# Patient Record
Sex: Male | Born: 1990 | Race: White | Hispanic: No | Marital: Married | Smoking: Former smoker
Health system: Southern US, Community
[De-identification: ages and names within clinical notes are randomized; demographics above are authoritative.]

## PROBLEM LIST (undated history)

## (undated) ENCOUNTER — Emergency Department (HOSPITAL_BASED_OUTPATIENT_CLINIC_OR_DEPARTMENT_OTHER): Admission: EM | Payer: No Typology Code available for payment source

---

## 2010-04-20 ENCOUNTER — Emergency Department (HOSPITAL_BASED_OUTPATIENT_CLINIC_OR_DEPARTMENT_OTHER)
Admission: EM | Admit: 2010-04-20 | Discharge: 2010-04-20 | Payer: Self-pay | Source: Home / Self Care | Admitting: Emergency Medicine

## 2014-05-04 ENCOUNTER — Other Ambulatory Visit: Payer: Self-pay | Admitting: Family Medicine

## 2014-05-04 DIAGNOSIS — R109 Unspecified abdominal pain: Secondary | ICD-10-CM

## 2014-05-04 DIAGNOSIS — R10A1 Flank pain, right side: Secondary | ICD-10-CM

## 2014-05-10 ENCOUNTER — Ambulatory Visit
Admission: RE | Admit: 2014-05-10 | Discharge: 2014-05-10 | Disposition: A | Discharge status: Home / Self Care | Payer: No Typology Code available for payment source | Source: Ambulatory Visit | Attending: Family Medicine | Admitting: Family Medicine

## 2014-05-10 DIAGNOSIS — R109 Unspecified abdominal pain: Secondary | ICD-10-CM

## 2017-09-09 ENCOUNTER — Ambulatory Visit (INDEPENDENT_AMBULATORY_CARE_PROVIDER_SITE_OTHER): Payer: No Typology Code available for payment source | Admitting: Family Medicine

## 2017-09-09 ENCOUNTER — Other Ambulatory Visit: Payer: Self-pay

## 2017-09-09 ENCOUNTER — Encounter: Payer: Self-pay | Admitting: Family Medicine

## 2017-09-09 VITALS — BP 112/76 | HR 67 | Temp 98.3°F | Ht 69.0 in | Wt 245.0 lb

## 2017-09-09 DIAGNOSIS — R200 Anesthesia of skin: Secondary | ICD-10-CM | POA: Insufficient documentation

## 2017-09-09 DIAGNOSIS — Z7689 Persons encountering health services in other specified circumstances: Secondary | ICD-10-CM | POA: Insufficient documentation

## 2017-09-09 DIAGNOSIS — Z23 Encounter for immunization: Secondary | ICD-10-CM

## 2017-09-09 NOTE — Progress Notes (Signed)
   Subjective:    Benjamin Mack - 27 y.o. male MRN [id number]  Date of birth: 1990/07/07  HPI  Benjamin Mack is here to establish care.  His concerns include: numbness in right shoulder.  The numbness started gradually over the last six months to one year and occurs every day.  His symptoms are intermittent and are accompanied by cramps with subsequent soreness the next day.  Sometimes he feels tingling in the area, but he rarely feels pain.  He has not had a previous injury to his shoulder or back, but he does lift heavy shingles daily.  Nothing seems to make it worse, and he has not tried anything to make it better.  PMH: none  PSH:  none  Meds: none  Social Hx: does not drink alcohol or use recreational drugs.  Quit cigarettes in 2013.  Lives with wife, Donata Clay, and daughter, Dorita Fray.  Works as a Designer, fashion/clothing.  Health Maintenance:  Health Maintenance Due  Topic Date Due  . HIV Screening  02/26/2005  . TETANUS/TDAP  02/26/2009    -  reports that he quit smoking about 6 years ago. He has never used smokeless tobacco. - Review of Systems: Per HPI. - Past Medical History: Patient Active Problem List   Diagnosis Date Noted  . Encounter to establish care with new doctor 09/09/2017  . Numbness 09/09/2017   - Medications: reviewed and updated   Objective:   Physical Exam BP 112/76   Pulse 67   Temp 98.3 F (36.8 C) (Oral)   Ht  (1.753 m)   Wt 245 lb (111.1 kg)   SpO2 97%   BMI 36.18 kg/m  Gen: NAD, alert, cooperative with exam, well-appearing, obese HEENT: NCAT, PERRL, clear conjunctiva, oropharynx clear, supple neck CV: RRR, good S1/S2, no murmur, no edema Resp: CTABL, no wheezes, non-labored Abd: SNTND, BS present, no guarding or organomegaly Skin: no rashes, normal turgor  Neuro: no gross deficits.  Psych: good insight, alert and oriented Musculoskeletal: no visual difference between shoulders, nontender to palpation, full ROM, strength testing normal but does elicit  feelings of numbness and tingling, especially with internal rotation.    Assessment & Plan:   Encounter to establish care with new doctor Received Tdap and HIV screening today.  Counseled on exercise and diet.  History reviewed.  Numbness Unsure what the cause of his numbness is, but it could be related to a traction injury from heavy lifting.  Likely nerve injury, but unsure if it is dermatomal in nature or cutaneous.  Will obtain cervical spine xrays.  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  He would like to abstain from medication currently, which is appropriate as well.    Lezlie Octave, M.D. 09/09/2017, 9:45 AM PGY-1, [facility]

## 2017-09-09 NOTE — Assessment & Plan Note (Signed)
Unsure what the cause of his numbness is, but it could be related to a traction injury from heavy lifting.  Likely nerve injury, but unsure if it is dermatomal in nature or cutaneous.  Will obtain cervical spine xrays.  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  He would like to abstain from medication currently, which is appropriate as well.

## 2017-09-09 NOTE — Patient Instructions (Addendum)
It was nice meeting you today Benjamin Mack!  I am ordering cervical spine x rays, which you can get completed by going to [facility] at [facility] [address].  They are open 8-5 M-F and you do not need an appointment.  We will let you know if your results from your HIV screen or your xrays are abnormal.    I would like to see you back in one year or earlier if you need.  If you have any questions or concerns, please feel free to call the clinic.   Be well,  Dr. Frances Furbish

## 2017-09-09 NOTE — Assessment & Plan Note (Signed)
Received Tdap and HIV screening today.  Counseled on exercise and diet.  History reviewed.

## 2017-09-10 ENCOUNTER — Telehealth: Payer: Self-pay | Admitting: *Deleted

## 2017-09-10 LAB — HIV ANTIBODY (ROUTINE TESTING W REFLEX): HIV Screen 4th Generation wRfx: NONREACTIVE

## 2017-09-10 NOTE — Telephone Encounter (Signed)
LVM to call office back to inform pt of below. Zimmerman Rumple, Oluwadamilola Deliz D, CMA  

## 2017-09-10 NOTE — Telephone Encounter (Signed)
-----   Message from Lennox Solders, MD sent at 09/10/2017  9:17 AM EDT ----- [name] HIV screen is negative.

## 2017-09-16 NOTE — Telephone Encounter (Signed)
Pt informed. Zimmerman Rumple, Jeimy Bickert D, CMA  

## 2017-09-16 NOTE — Telephone Encounter (Signed)
Pt returning call to April. Call back [phone] Shawna Orleans, RN

## 2017-09-18 ENCOUNTER — Ambulatory Visit (INDEPENDENT_AMBULATORY_CARE_PROVIDER_SITE_OTHER): Payer: No Typology Code available for payment source | Admitting: Family Medicine

## 2017-09-18 ENCOUNTER — Other Ambulatory Visit: Payer: Self-pay

## 2017-09-18 ENCOUNTER — Encounter: Payer: Self-pay | Admitting: Family Medicine

## 2017-09-18 VITALS — BP 114/88 | HR 54 | Temp 98.9°F | Ht 69.0 in | Wt 244.2 lb

## 2017-09-18 DIAGNOSIS — W57XXXA Bitten or stung by nonvenomous insect and other nonvenomous arthropods, initial encounter: Secondary | ICD-10-CM

## 2017-09-18 DIAGNOSIS — S70362A Insect bite (nonvenomous), left thigh, initial encounter: Secondary | ICD-10-CM

## 2017-09-18 NOTE — Patient Instructions (Addendum)
It was great meeting you today Benjamin Mack! I am sorry you had to come in with that tick bit. I was able to remove the rest of the tick in pieces with tweezers. The ticks in [place] typically do not carry tick born illnesses such as lymes disease. The bigger risk is that they can predispose you getting a skin infection like any other insect bite would. I would recommend placing neosporin on the bite site for the next three days, two times per day to prevent a skin infection.  I would invest in some tick repellant spray if you are going to be working outside during the summer months. I would get OFF! Deep woods or ben's insect and tick repellent which can be bought for around $5 at [employer] or [employer]. Please come back and see [place] if you have any concerns about the site or develop any concerning symptoms.

## 2017-09-25 NOTE — Assessment & Plan Note (Signed)
Able to extract rest of tick head out with small amount of blood dissection with tweezers. Recommended putting bactroban ointment on wound a couple of times per day for next couple of days to prevent infection. Also recommended getting tick repellant to prevent future bites.

## 2017-09-25 NOTE — Progress Notes (Signed)
   HPI Patient presents as a same day after a tick bite 1 days prior to presentation. 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 He was able to get almost all of the tick out, but noticed that the tick head was still present. When he was unable to get all of it out, he came in for a same day appointment.  CC: tick bite   ROS:   Review of Systems See HPI for ROS.   CC, SH/smoking status, and VS noted  Objective: BP 114/88   Pulse (!) 54   Temp 98.9 F (37.2 C) (Oral)   Ht 5\' 9"  (1.753 m)   Wt 244 lb 3.2 oz (110.8 kg)   SpO2 98%   BMI 36.06 kg/m  Gen: NAD, alert, cooperative, and pleasant. HEENT: NCAT, EOMI, PERRL CV: RRR, no murmur Resp: CTAB, no wheezes, non-labored Abd: SNTND, BS present, no guarding or organomegaly Ext: No edema, warm Neuro: Alert and oriented, Speech clear, No gross deficits Left thigh: very small area of red skin, small amount of black arthropod in wound consistent with tick head   Assessment and plan:  Tick bite with subsequent removal of tick Able to extract rest of tick head out with small amount of blood dissection with tweezers. Recommended putting bactroban ointment on wound a couple of times per day for next couple of days to prevent infection. Also recommended getting tick repellant to prevent future bites.   No orders of the defined types were placed in this encounter.   No orders of the defined types were placed in this encounter.    Myrene BuddyJacob Len Azeez MD PGY-1 Family Medicine Resident  09/25/2017 6:46 AM

## 2020-08-24 ENCOUNTER — Telehealth: Payer: Self-pay

## 2020-08-24 NOTE — Telephone Encounter (Signed)
I called left pt a message to see if he intends to remain a pt, and advised to call before 09/18/20 if he desires to remain a pt here.

## 2021-04-12 ENCOUNTER — Other Ambulatory Visit: Payer: Self-pay

## 2021-04-12 ENCOUNTER — Ambulatory Visit
Admission: RE | Admit: 2021-04-12 | Discharge: 2021-04-12 | Disposition: A | Discharge status: Home / Self Care | Payer: No Typology Code available for payment source | Source: Ambulatory Visit | Attending: Emergency Medicine | Admitting: Emergency Medicine

## 2021-04-12 ENCOUNTER — Ambulatory Visit (HOSPITAL_BASED_OUTPATIENT_CLINIC_OR_DEPARTMENT_OTHER)
Admission: RE | Admit: 2021-04-12 | Discharge: 2021-04-12 | Disposition: A | Discharge status: Home / Self Care | Payer: No Typology Code available for payment source | Source: Ambulatory Visit | Attending: Emergency Medicine | Admitting: Emergency Medicine

## 2021-04-12 VITALS — BP 138/77 | HR 69 | Temp 98.7°F | Resp 20

## 2021-04-12 DIAGNOSIS — M25461 Effusion, right knee: Secondary | ICD-10-CM | POA: Diagnosis present

## 2021-04-12 DIAGNOSIS — M25561 Pain in right knee: Secondary | ICD-10-CM

## 2021-04-12 DIAGNOSIS — S86911A Strain of unspecified muscle(s) and tendon(s) at lower leg level, right leg, initial encounter: Secondary | ICD-10-CM

## 2021-04-12 MED ORDER — IBUPROFEN 800 MG PO TABS: 800.0000 mg | ORAL_TABLET | Freq: Three times a day (TID) | ORAL | 0 refills | Status: AC

## 2021-04-12 NOTE — ED Provider Notes (Signed)
[facility]    CSN: [id number] Arrival date & time: 04/12/21  1133    HISTORY  No chief complaint on file.  HPI Benjamin Mack is a 30 y.o. male. Patient states that last night he fell while running, states his right lower leg twisted inward and he landed on his right knee.  Patient states he has noticed increased swelling in his right knee, states he has no pain with flexion extension but when he tries to rotate his foot in and out or stand on his right leg, he has pain in his right knee on the lateral side.  Patient states he has been taking ibuprofen for pain.  The history is provided by the patient.  No past medical history on file. Patient Active Problem List   Diagnosis Date Noted   Tick bite with subsequent removal of tick 09/18/2017   Encounter to establish care with new doctor 09/09/2017   Numbness 09/09/2017   No past surgical history on file.  Home Medications    Prior to Admission medications   Medication Sig Start Date End Date Taking? Authorizing Provider  ibuprofen (ADVIL) 800 MG tablet Take 1 tablet (800 mg total) by mouth 3 (three) times daily. 04/12/21  Yes Theadora Rama Scales, PA-C    Family History Family History  Problem Relation Age of Onset   Birth defects Maternal Grandfather    Social History Social History   Tobacco Use   Smoking status: Former    Types: Cigarettes    Quit date: 2013    Years since quitting: 9.9   Smokeless tobacco: Never  Vaping Use   Vaping Use: Never used  Substance Use Topics   Alcohol use: Not Currently   Drug use: Not Currently   Allergies   Patient has no known allergies.  Review of Systems Review of Systems Pertinent findings noted in history of present illness.   Physical Exam Triage Vital Signs ED Triage Vitals  Enc Vitals Group     BP 02/15/21 0827 (!) 147/82     Pulse Rate 02/15/21 0827 72     Resp 02/15/21 0827 18     Temp 02/15/21 0827 98.3 F (36.8 C)     Temp Source 02/15/21  0827 Oral     SpO2 02/15/21 0827 98 %     Weight --      Height --      Head Circumference --      Peak Flow --      Pain Score 02/15/21 0826 5     Pain Loc --      Pain Edu? --      Excl. in GC? --   No data found.  Updated Vital Signs BP 138/77 (BP Location: Right Arm)    Pulse 69    Temp 98.7 F (37.1 C) (Oral)    Resp 20    SpO2 98%   Physical Exam Vitals and nursing note reviewed.  Constitutional:      General: He is not in acute distress.    Appearance: Normal appearance. He is not ill-appearing.  HENT:     Head: Normocephalic and atraumatic.  Eyes:     General: Lids are normal.        Right eye: No discharge.        Left eye: No discharge.     Extraocular Movements: Extraocular movements intact.     Conjunctiva/sclera: Conjunctivae normal.     Right eye: Right conjunctiva is not injected.  Left eye: Left conjunctiva is not injected.  Neck:     Trachea: Trachea and phonation normal.  Cardiovascular:     Rate and Rhythm: Normal rate and regular rhythm.     Pulses: Normal pulses.     Heart sounds: Normal heart sounds. No murmur heard.   No friction rub. No gallop.  Pulmonary:     Effort: Pulmonary effort is normal. No accessory muscle usage, prolonged expiration or respiratory distress.     Breath sounds: Normal breath sounds. No stridor, decreased air movement or transmitted upper airway sounds. No decreased breath sounds, wheezing, rhonchi or rales.  Chest:     Chest wall: No tenderness.  Musculoskeletal:        General: Swelling (Right knee) and tenderness (Right LCL) present. Normal range of motion.     Cervical back: Normal range of motion and neck supple. Normal range of motion.  Lymphadenopathy:     Cervical: No cervical adenopathy.  Skin:    General: Skin is warm and dry.     Findings: No erythema or rash.  Neurological:     General: No focal deficit present.     Mental Status: He is alert and oriented to person, place, and time.  Psychiatric:         Mood and Affect: Mood normal.        Behavior: Behavior normal.    Visual Acuity Right Eye Distance:   Left Eye Distance:   Bilateral Distance:    Right Eye Near:   Left Eye Near:    Bilateral Near:     UC Couse / Diagnostics / Procedures:    EKG  Radiology No results found.  Procedures Procedures (including critical care time)  UC Diagnoses / Final Clinical Impressions(s)   I have reviewed the triage vital signs and the nursing notes.  Pertinent labs & imaging results that were available during my care of the patient were reviewed by me and considered in my medical decision making (see chart for details).    Final diagnoses:  Knee strain, right, initial encounter  Acute pain of right knee   Patient placed in a hinged knee brace to restrict lateral movement of right knee.  X-ray ordered, patient advised we will contact him with the results and further recommendations if any.  Patient provided with prescription for ibuprofen 800 mg, recommend elevation and ice as much as possible for the next several days.  ED Prescriptions     Medication Sig Dispense Auth. Provider   ibuprofen (ADVIL) 800 MG tablet Take 1 tablet (800 mg total) by mouth 3 (three) times daily. 21 tablet Theadora Rama Scales, PA-C      PDMP not reviewed this encounter.  Pending results:  Labs Reviewed - No data to display  Medications Ordered in UC: Medications - No data to display  Disposition Upon Discharge:  Condition: stable for discharge home Home: take medications as prescribed; routine discharge instructions as discussed; follow up as advised.  Patient presented with an acute illness with associated systemic symptoms and significant discomfort requiring urgent management. In my opinion, this is a condition that a prudent lay person (someone who possesses an average knowledge of health and medicine) may potentially expect to result in complications if not addressed urgently such as  respiratory distress, impairment of bodily function or dysfunction of bodily organs.   Routine symptom specific, illness specific and/or disease specific instructions were discussed with the patient and/or caregiver at length.   As such, the patient  has been evaluated and assessed, work-up was performed and treatment was provided in alignment with urgent care protocols and evidence based medicine.  Patient/parent/caregiver has been advised that the patient may require follow up for further testing and treatment if the symptoms continue in spite of treatment, as clinically indicated and appropriate.  If the patient was tested for COVID-19, Influenza and/or RSV, then the patient/parent/guardian was advised to isolate at home pending the results of his/her diagnostic coronavirus test and potentially longer if theyre positive. I have also advised pt that if his/her COVID-19 test returns positive, it's recommended to self-isolate for at least 10 days after symptoms first appeared AND until fever-free for 24 hours without fever reducer AND other symptoms have improved or resolved. Discussed self-isolation recommendations as well as instructions for household member/close contacts as per the [facility] and [place] DHHS, and also gave patient the COVID packet with this information.  Patient/parent/caregiver has been advised to return to the [facility] or PCP in 3-5 days if no better; to PCP or the Emergency Department if new signs and symptoms develop, or if the current signs or symptoms continue to change or worsen for further workup, evaluation and treatment as clinically indicated and appropriate  The patient will follow up with their current PCP if and as advised. If the patient does not currently have a PCP we will assist them in obtaining one.   The patient may need specialty follow up if the symptoms continue, in spite of conservative treatment and management, for further workup, evaluation, consultation and treatment as  clinically indicated and appropriate.   Patient/parent/caregiver verbalized understanding and agreement of plan as discussed.  All questions were addressed during visit.  Please see discharge instructions below for further details of plan.  Discharge Instructions:   Discharge Instructions      Please go to the [facility] main entrance to let them know that you have an x-ray ordered of your right knee.  Once the results are complete, I usually receive a message in about an hour, I will contact you with results and let you know what needs to be done next.  In the meantime, please continue to wear the hinged knee brace to immobilize lateral movement of your right knee.  I also provided you with a 7-day prescription of ibuprofen 800 mg to take as needed for pain.  I recommend ice and elevation is much as possible until your knee is feeling better.    This office note has been dictated using Teaching laboratory technician.  Unfortunately, and despite my best efforts, this method of dictation can sometimes lead to occasional typographical or grammatical errors.  I apologize in advance if this occurs.     Theadora Rama Scales, [place] 04/12/21 1207

## 2021-04-12 NOTE — ED Triage Notes (Signed)
Pt reports having a fall last night and twisted his right knee. Patient is ambulatory at this time.

## 2021-04-12 NOTE — Discharge Instructions (Addendum)
Please go to the [facility] main entrance to let them know that you have an x-ray ordered of your right knee.  Once the results are complete, I usually receive a message in about an hour, I will contact you with results and let you know what needs to be done next.  In the meantime, please continue to wear the hinged knee brace to immobilize lateral movement of your right knee.  I also provided you with a 7-day prescription of ibuprofen 800 mg to take as needed for pain.  I recommend ice and elevation is much as possible until your knee is feeling better.

## 2023-07-20 IMAGING — DX DG KNEE COMPLETE 4+V*R*
4 series · 4 of 4 positions shown · non-contrast
Comparison: None.

CLINICAL DATA: Fell yesterday. Pain and swelling.

EXAM:
RIGHT KNEE - COMPLETE 4+ VIEW

[knee ap]
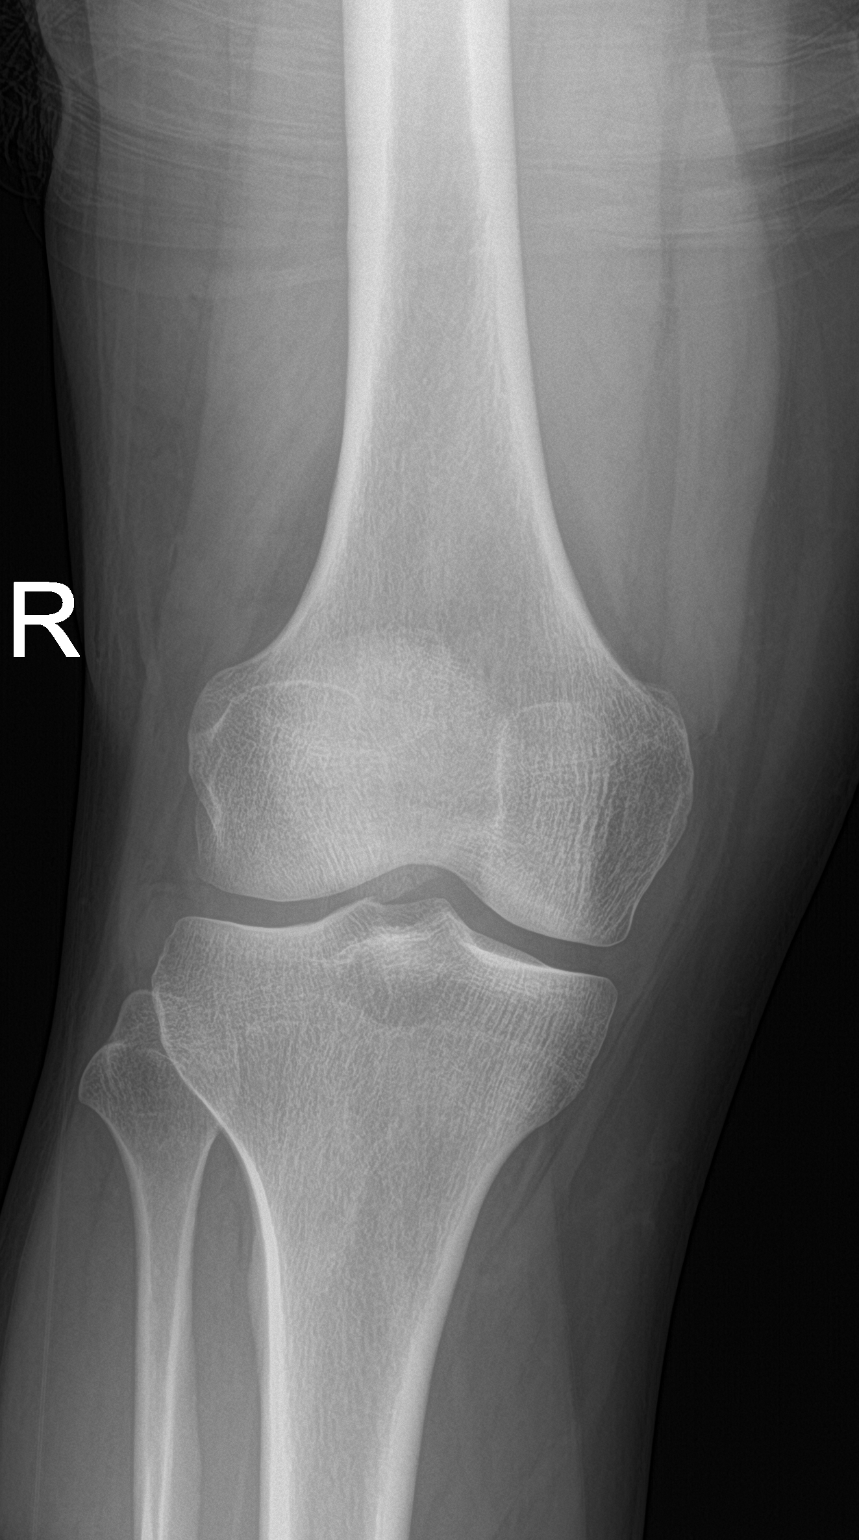

[knee lat]
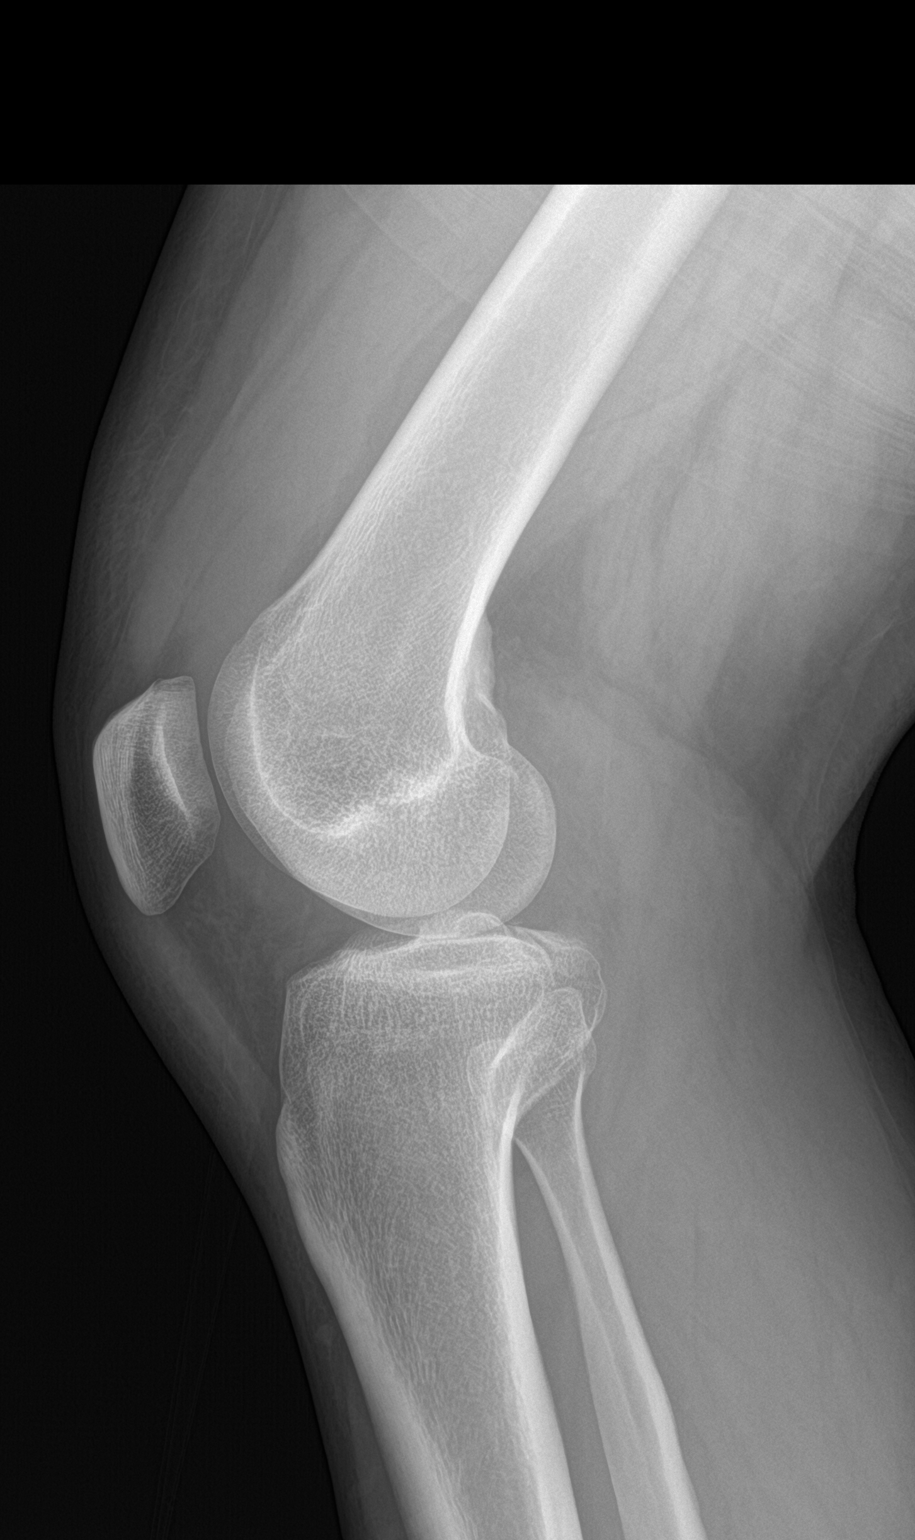

[knee obl (1 of 2)]
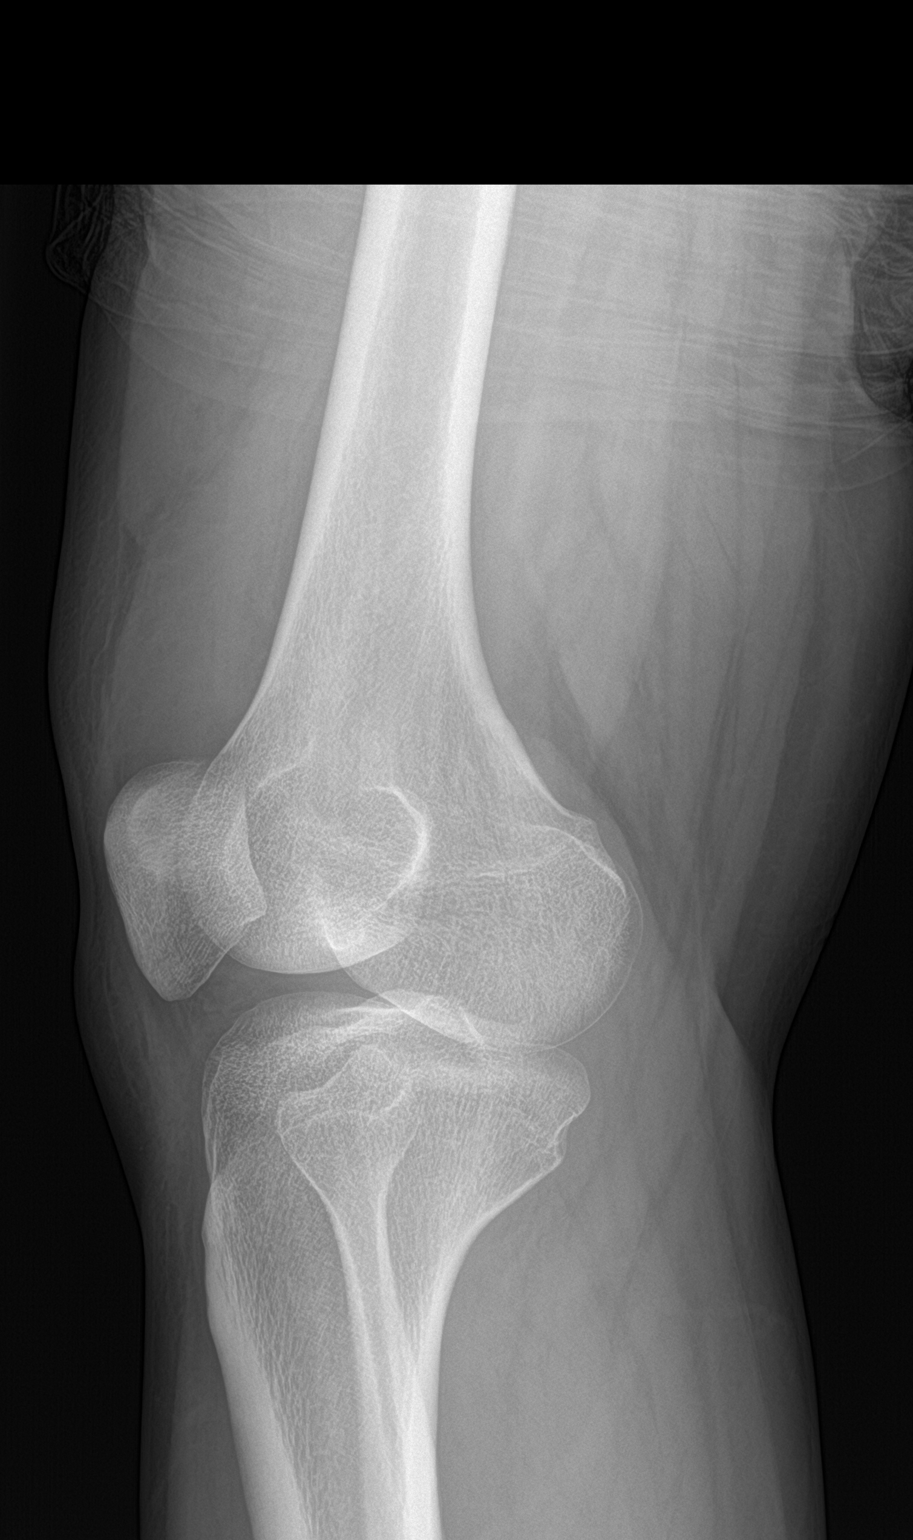

[knee obl (2 of 2)]
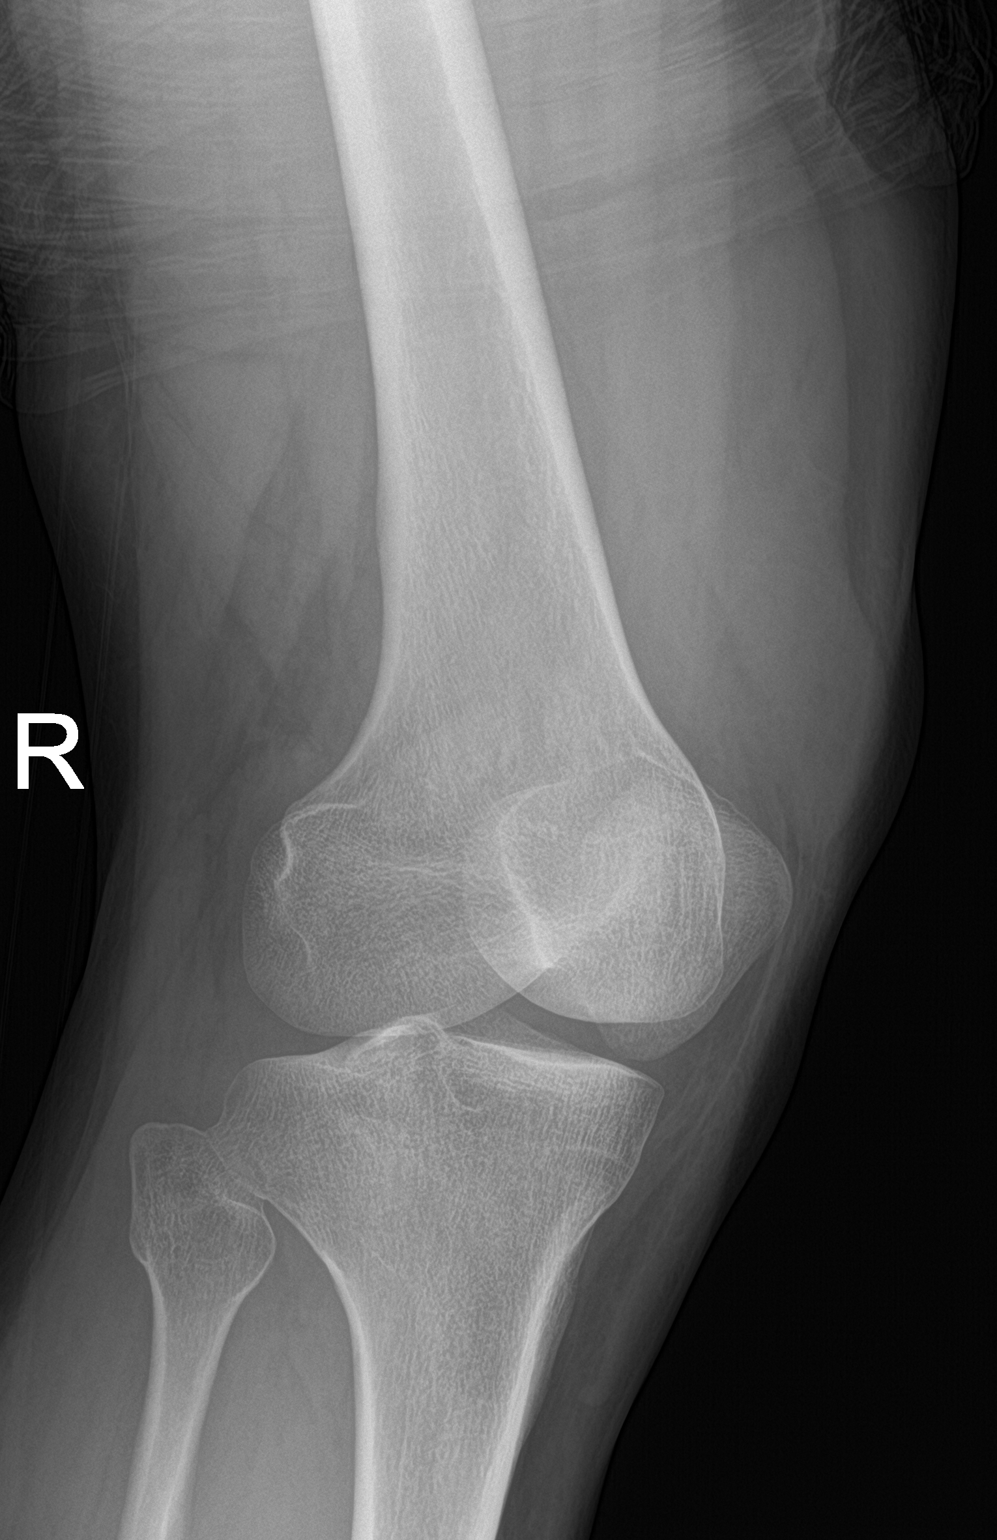

[4 of 4 positions shown; findings below may reference images not displayed]

FINDINGS: Moderate to large knee joint effusion. No fat fluid level. No
evidence of fracture or degenerative change.
IMPRESSION: Moderate to large knee joint effusion. No other finding.
